# Patient Record
Sex: Female | Born: 1955 | Race: White | Hispanic: No | Marital: Single | Smoking: Never smoker
Health system: Southern US, Community
[De-identification: ages and names within clinical notes are randomized; demographics above are authoritative.]

## PROBLEM LIST (undated history)

## (undated) DIAGNOSIS — F419 Anxiety disorder, unspecified: Secondary | ICD-10-CM

## (undated) DIAGNOSIS — M419 Scoliosis, unspecified: Secondary | ICD-10-CM

## (undated) HISTORY — PX: SPINAL FUSION: SHX223

---

## 2017-03-26 ENCOUNTER — Emergency Department (HOSPITAL_COMMUNITY)
Admission: EM | Admit: 2017-03-26 | Discharge: 2017-03-26 | Disposition: A | Discharge status: Home / Self Care | Payer: PRIVATE HEALTH INSURANCE | Attending: Emergency Medicine | Admitting: Emergency Medicine

## 2017-03-26 ENCOUNTER — Emergency Department (HOSPITAL_COMMUNITY): Payer: PRIVATE HEALTH INSURANCE

## 2017-03-26 ENCOUNTER — Encounter (HOSPITAL_COMMUNITY): Payer: Self-pay | Admitting: Emergency Medicine

## 2017-03-26 DIAGNOSIS — S29012A Strain of muscle and tendon of back wall of thorax, initial encounter: Secondary | ICD-10-CM | POA: Diagnosis not present

## 2017-03-26 DIAGNOSIS — S299XXA Unspecified injury of thorax, initial encounter: Secondary | ICD-10-CM | POA: Diagnosis present

## 2017-03-26 DIAGNOSIS — M546 Pain in thoracic spine: Secondary | ICD-10-CM

## 2017-03-26 DIAGNOSIS — Y999 Unspecified external cause status: Secondary | ICD-10-CM | POA: Insufficient documentation

## 2017-03-26 DIAGNOSIS — Z79899 Other long term (current) drug therapy: Secondary | ICD-10-CM | POA: Insufficient documentation

## 2017-03-26 DIAGNOSIS — X500XXA Overexertion from strenuous movement or load, initial encounter: Secondary | ICD-10-CM | POA: Diagnosis not present

## 2017-03-26 DIAGNOSIS — Y939 Activity, unspecified: Secondary | ICD-10-CM | POA: Insufficient documentation

## 2017-03-26 DIAGNOSIS — Y929 Unspecified place or not applicable: Secondary | ICD-10-CM | POA: Insufficient documentation

## 2017-03-26 DIAGNOSIS — G8929 Other chronic pain: Secondary | ICD-10-CM

## 2017-03-26 DIAGNOSIS — T148XXA Other injury of unspecified body region, initial encounter: Secondary | ICD-10-CM

## 2017-03-26 HISTORY — DX: Anxiety disorder, unspecified: F41.9

## 2017-03-26 HISTORY — DX: Scoliosis, unspecified: M41.9

## 2017-03-26 MED ORDER — KETOROLAC TROMETHAMINE 15 MG/ML IJ SOLN: 15.0000 mg | Freq: Once | INTRAMUSCULAR | Status: DC

## 2017-03-26 MED ORDER — METHOCARBAMOL 500 MG PO TABS: 500.0000 mg | ORAL_TABLET | Freq: Two times a day (BID) | ORAL | 0 refills | Status: AC

## 2017-03-26 MED ORDER — KETOROLAC TROMETHAMINE 15 MG/ML IJ SOLN
15.0000 mg | Freq: Once | INTRAMUSCULAR | Status: AC
  Administered 2017-03-26: 15 mg via INTRAMUSCULAR
  Filled 2017-03-26: qty 1

## 2017-03-26 NOTE — Discharge Instructions (Signed)
You were given a prescription for Robaxin. You should avoid driving, working, operating machinery while taking this medication as it can make you drowsy.  You may also take Aleve for your symptoms.  You should contact your spine doctor at home and inform them of the symptoms you are having these last few days.  You should return to the emergency department for any chest pain, shortness of breath, numbness or weakness to your legs, loss of control of your bowels or bladder, inability to urinate, or any new or worsening symptoms.

## 2017-03-26 NOTE — ED Provider Notes (Signed)
[facility]-EMERGENCY DEPT Provider Note   CSN: [id number] Arrival date & time: 03/26/17  1234     History   Chief Complaint Chief Complaint  Patient presents with  . Back Pain    HPI Laura Townsend is a 62 y.o. female.  HPI   Pt is 62 y/o female with a complex spinal disease hx including a h/o scoliosis, kyphosis, DDD, spinal stenosis, spondylolisthesis, spinal fusion, chronic back pain who presents to the ED today c/o 8/10 midline and right sided upper back pain that began yesterday after she carried several heavy bags throughout the airport. Pt right handed.  States pain has stayed constant since yesterday. Has taken aleve with mild relief. States pain is constant and worsened with certain movements. States that she had had similar pain in the past multiple times and has been evaluated by her spine doctor in [place] for this who dx her with musculoskeletal pain, gave her antiinflammatories, and pain resolved after about 1-2 weeks.  She reports intermittent weakness/numbness to BLE, worse in right. No numbness/weakness/paresthesias currently. Has been ambulatory without issue during the last 2 days and denies any falls.  States she has had sxs like this in the past with her chronic back pain. Denies saddle anesthesia. Denies loss of control of bowels or bladder. No urinary retention. No fevers. Denies a h/o IVDU. Denies a h/o CA or recent unintended weight loss.  Has not had any recent spinal injections.   Past Medical History:  Diagnosis Date  . Anxiety   . Scoliosis     There are no active problems to display for this patient.   OB History    No data available       Home Medications    Prior to Admission medications   Medication Sig Start Date End Date Taking? Authorizing Provider  buPROPion (WELLBUTRIN) 100 MG tablet Take 1 tablet by mouth 2 (two) times daily. 03/17/17  Yes [provider]  gabapentin (NEURONTIN) 100 MG capsule Take 4-6  capsules by mouth 3 (three) times daily. 400mg  in the morning, 400mg  in the evening, 600mg  at night 03/20/17  Yes [provider]  LORazepam (ATIVAN) 0.5 MG tablet Take 1 tablet by mouth 3 (three) times daily as needed for anxiety. 03/17/17  Yes [provider]  naratriptan (AMERGE) 2.5 MG tablet Take 1 tablet by mouth daily as needed for migraine. 02/10/17  Yes [provider]  methocarbamol (ROBAXIN) 500 MG tablet Take 1 tablet (500 mg total) by mouth 2 (two) times daily. 03/26/17   Miela Desjardin S, PA-C    Family History No family history on file.  Social History Social History   Tobacco Use  . Smoking status: Not on file  Substance Use Topics  . Alcohol use: Not on file  . Drug use: Not on file     Allergies   Cephalosporins and Penicillins   Review of Systems Review of Systems  Constitutional: Negative for fever.  HENT: Negative for ear pain.   Respiratory: Negative for shortness of breath.   Cardiovascular: Negative for chest pain, palpitations and leg swelling.  Gastrointestinal: Negative for abdominal pain, constipation, diarrhea, nausea and vomiting.       No bowel incontinence  Genitourinary: Negative for dysuria, flank pain, hematuria and urgency.       No urinary incontinence, no urinary retention  Musculoskeletal: Positive for back pain. Negative for gait problem and neck pain.  Skin: Negative for rash.  Neurological: Positive for weakness (resolved)  and numbness (resolved). Negative for headaches.       No saddle anesthesia     Physical Exam Updated Vital Signs BP 137/78 (BP Location: Left Arm)   Pulse 63   Temp (!) 97.5 F (36.4 C) (Oral)   Resp 18   SpO2 100%   Physical Exam  Constitutional: She appears well-developed and well-nourished. No distress.  HENT:  Head: Normocephalic and atraumatic.  Eyes: Conjunctivae are normal.  Neck: Normal range of motion. Neck supple.  No Cervical spine tenderness to midline    Cardiovascular: Normal rate, regular rhythm, normal heart sounds and intact distal pulses.  No murmur heard. Pulmonary/Chest: Effort normal and breath sounds normal. No respiratory distress.  Abdominal: Soft. There is no tenderness.  Musculoskeletal: She exhibits no edema.  Severe scoliosis noted to the back, multiple surgical scars along the midline.  Tenderness along surgical scar to thoracic back as well as right paraspinous tenderness that reproduces her pain.  Neurological: She is alert.  Motor:  Normal tone. 5/5 strength of BUE and BLE major muscle groups including strong and equal grip strength and dorsiflexion/plantar flexion Sensory: light touch normal in all extremities. DTRs: patellar and achilles 2+ symmetric b/l Cerebellar: normal finger-to-nose with bilateral upper extremities Gait: normal gait and balance. Walks briskly without obvious pain or limp. Able to walk on toes and heels with ease.  CV: 2+ radial and DP/PT pulses  Skin: Skin is warm and dry. Capillary refill takes less than 2 seconds.  Psychiatric: She has a normal mood and affect.  Nursing note and vitals reviewed.    ED Treatments / Results  Labs (all labs ordered are listed, but only abnormal results are displayed) Labs Reviewed - No data to display  EKG  EKG Interpretation None       Radiology Dg Thoracic Spine 2 View  Result Date: 03/26/2017 CLINICAL DATA:  Increased upper back pain after carrying heavy luggage yesterday. EXAM: THORACIC SPINE 2 VIEWS COMPARISON:  None. FINDINGS: Severe scoliotic curvature to the thoracic spine, apex to the right. Kyphosis in the midthoracic spine. IMPRESSION: Marked scoliotic and postsurgical changes in the thoracic spine. There is a focal kyphosis in the midthoracic spine. These findings could all be chronic but it would be difficult to evaluate for an acute on chronic process in the setting of the chronic changes given the lack of previous studies. Electronically  Signed   By: Gerome Sam III M.D   On: 03/26/2017 17:18    Procedures Procedures (including critical care time)  Medications Ordered in ED Medications  ketorolac (TORADOL) 15 MG/ML injection 15 mg (15 mg Intramuscular Given 03/26/17 1712)     Initial Impression / Assessment and Plan / ED Course  I have reviewed the triage vital signs and the nursing notes.  Pertinent labs & imaging results that were available during my care of the patient were reviewed by me and considered in my medical decision making (see chart for details).    Discussed pt presentation and exam findings with Dr. Silverio Lay, who reviewed results of xray and agrees with the plan to d/c pt with outpt f/u.  Final Clinical Impressions(s) / ED Diagnoses   Final diagnoses:  Chronic thoracic back pain, unspecified back pain laterality  Muscle strain   Patient with acute exacerbation of back pain after carrying multiple heavy suitcases throughout the airport yesterday.  X-ray of thoracic spine showed scoliosis and postsurgical changes as well as focal kyphosis in the midthoracic spine, which patient states is chronic.  oradol injection administered in the ED which improved symptoms to patient's baseline.  Patient requesting Robaxin as this is helped her in the past.  Will give Rx for this.  No neurological deficits and normal neuro exam.  Patient can walk without pain.  No loss of bowel or bladder control.  No concern for cauda equina.  No fever, night sweats, weight loss, h/o cancer, IVDU.  RICE protocol and pain medicine indicated and discussed with patient. Do not feel that there is any new injury or change given patient states that her symptoms are similar to past and are resolved after Toradol in the ED. 5 patient to inform her spinal doctor of her visit to the emergency department today and gave her strict return precautions for any new or worsening symptoms or symptoms suggesting cauda equina or neurologic deficit.  Patient  understands reasons to return and agrees to follow-up as discussed.  All questions were answered patient is comfortable with the plan.   ED Discharge Orders        Ordered    methocarbamol (ROBAXIN) 500 MG tablet  2 times daily     03/26/17 1751       Joshlyn Beadle, [place], PA-C 03/26/17 1824    Charlynne Pander, MD 03/26/17 [phone]

## 2017-03-26 NOTE — ED Triage Notes (Signed)
Patient from out of town c/o upper back pain after carrying suitcase through airports yesterday. Hx scoliosis and spinal fusion. Reports "I have a specialist I see in [place], I just don't want the back pain to worsen while I am here visiting my mom."

## 2019-06-30 IMAGING — CR DG THORACIC SPINE 2V
3 series · 3 of 3 positions shown · non-contrast
Comparison: None.

CLINICAL DATA: Increased upper back pain after carrying heavy
luggage yesterday.

EXAM:
THORACIC SPINE 2 VIEWS

[t thoracic spine ap]
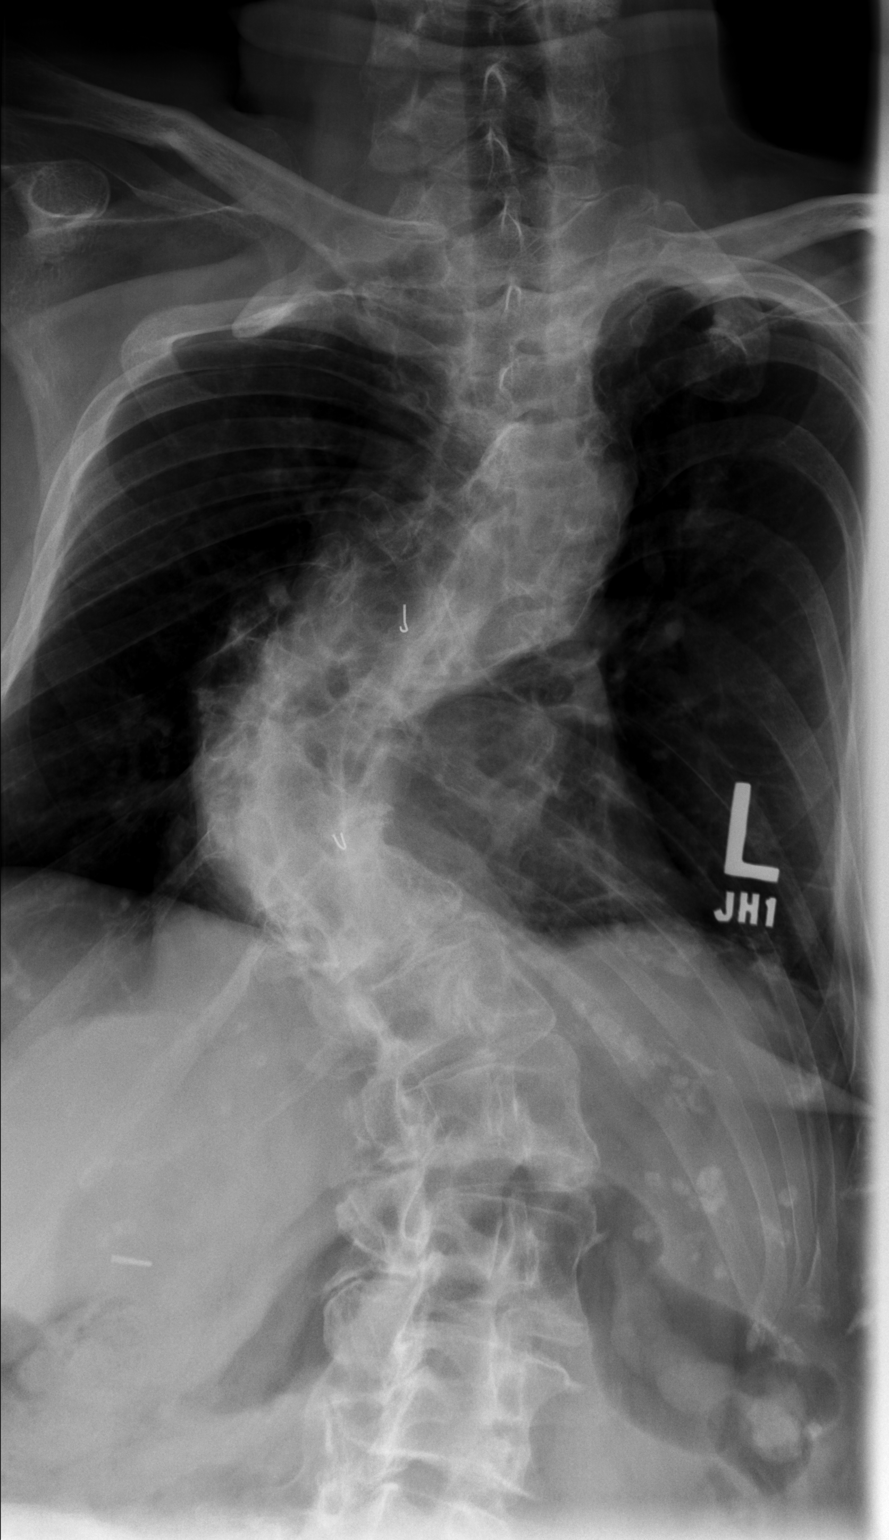

[t thoracic spine lat]
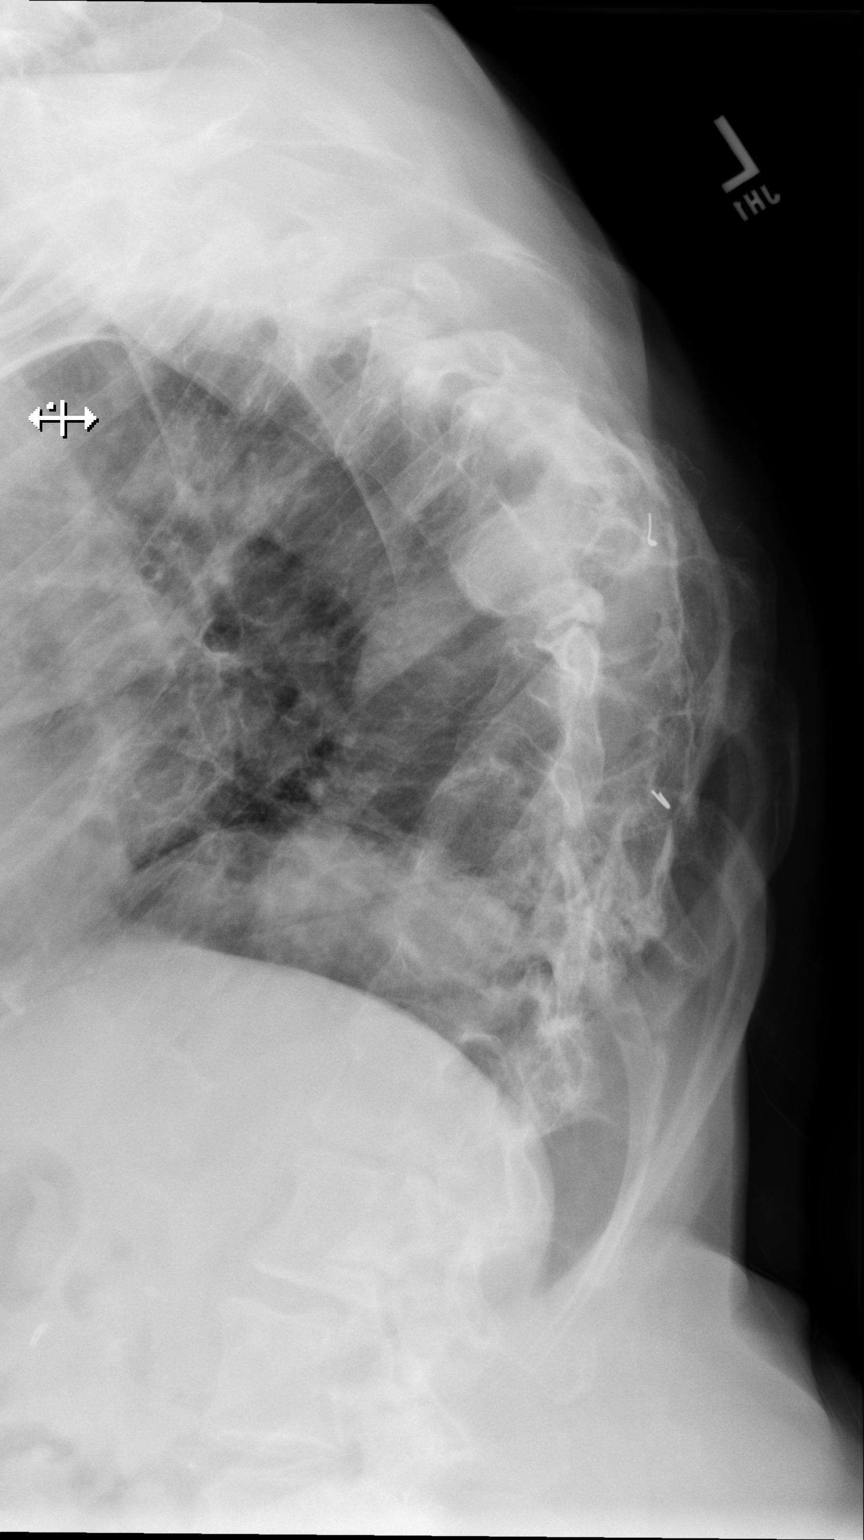

[t thoracic swimmers]
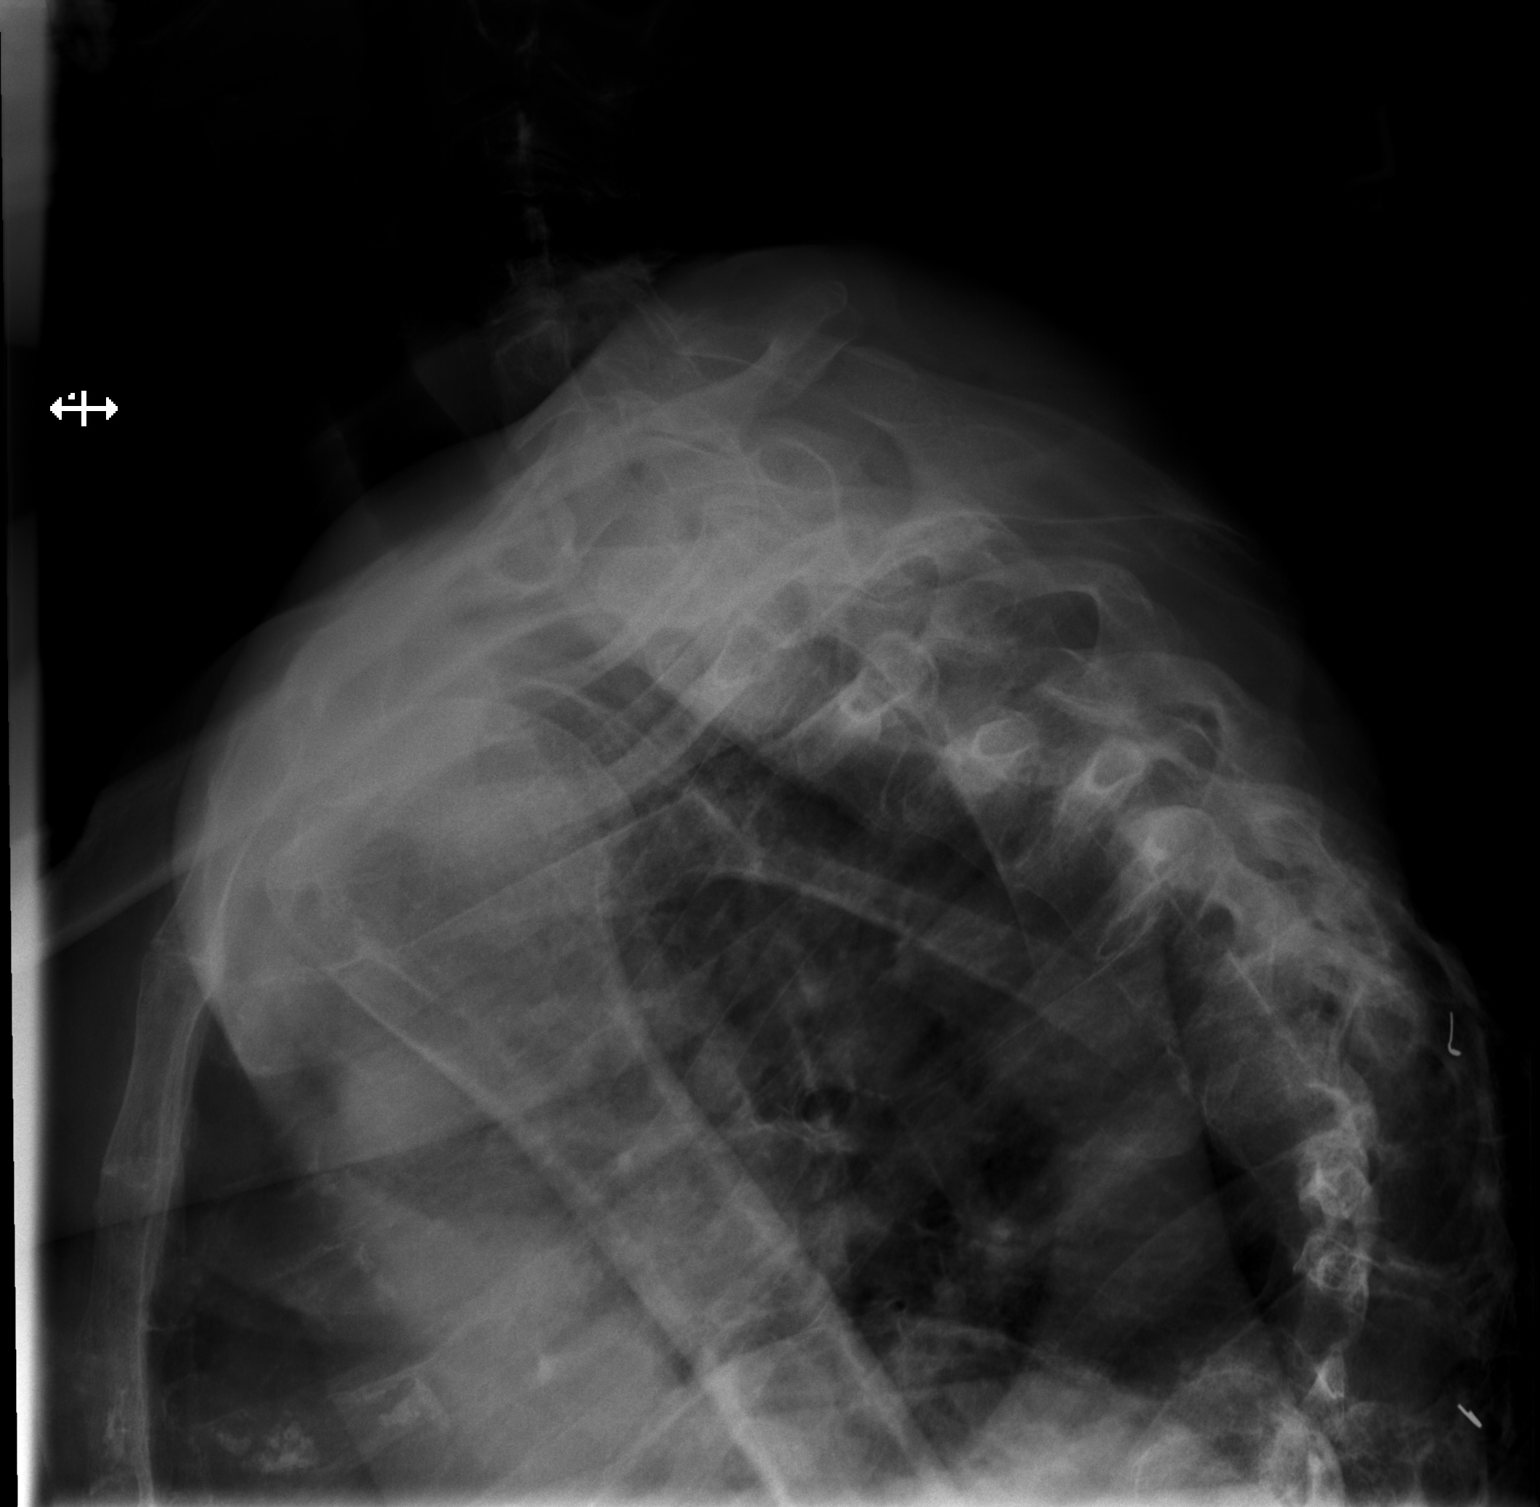

[3 of 3 positions shown; findings below may reference images not displayed]

FINDINGS: Severe scoliotic curvature to the thoracic spine, apex to the right.
Kyphosis in the midthoracic spine.
IMPRESSION: Marked scoliotic and postsurgical changes in the thoracic spine.
There is a focal kyphosis in the midthoracic spine. These findings
could all be chronic but it would be difficult to evaluate for an
acute on chronic process in the setting of the chronic changes given
the lack of previous studies.

## 2019-08-29 ENCOUNTER — Encounter: Payer: Self-pay | Admitting: Emergency Medicine

## 2019-08-29 ENCOUNTER — Ambulatory Visit
Admission: EM | Admit: 2019-08-29 | Discharge: 2019-08-29 | Disposition: A | Discharge status: Home / Self Care | Payer: PRIVATE HEALTH INSURANCE

## 2019-08-29 ENCOUNTER — Other Ambulatory Visit: Payer: Self-pay

## 2019-08-29 DIAGNOSIS — F4321 Adjustment disorder with depressed mood: Secondary | ICD-10-CM | POA: Diagnosis not present

## 2019-08-29 DIAGNOSIS — R0602 Shortness of breath: Secondary | ICD-10-CM | POA: Diagnosis not present

## 2019-08-29 DIAGNOSIS — F439 Reaction to severe stress, unspecified: Secondary | ICD-10-CM | POA: Diagnosis not present

## 2019-08-29 NOTE — ED Triage Notes (Addendum)
Patient has concerns for heavy breathing that she noticed today.  Patient states she has had intermittent episodes of heavy breathing that has occurred for 20 years.    Patient plans on getting on plane today

## 2019-08-29 NOTE — Discharge Instructions (Addendum)
Use inhaler if needed. Go to ER for worsening shortness of breath or you develop chest pain, lightheadedness, nausea/vomiting, weakness.

## 2019-08-29 NOTE — ED Provider Notes (Signed)
[facility]    CSN: [id number] Arrival date & time: 08/29/19  1153      History   Chief Complaint Chief Complaint  Patient presents with  . Shortness of Breath  . Fatigue    HPI Laura Townsend is a 64 y.o. female with history of scoliosis, anxiety presenting for perceived shortness of breath since today.  Noticed mild wheezing this morning: Did not take albuterol for this.  Denies chest pain, cough, fever, recent illness.  Patient has been under significant stress: Has been in [place] from out of state this week cleaning out her deceased mother's home.  Got into an argument with a family member, and has a flight later today.  Denies anxiety regarding flying.  Denies SI, HI.  Does have psychiatric care back home.  Palpitations, N/V, weakness, numbness, facial droop, confusion or slurred speech.   Past Medical History:  Diagnosis Date  . Anxiety   . Scoliosis     There are no problems to display for this patient.   Past Surgical History:  Procedure Laterality Date  . SPINAL FUSION      OB History   No obstetric history on file.      Home Medications    Prior to Admission medications   Medication Sig Start Date End Date Taking? Authorizing Provider  buPROPion (WELLBUTRIN) 100 MG tablet Take 1 tablet by mouth 2 (two) times daily. 03/17/17   [provider]  clonazePAM (KLONOPIN) 0.5 MG tablet Take 0.75 mg by mouth 2 (two) times daily. 08/26/19   [provider]  gabapentin (NEURONTIN) 100 MG capsule Take 4-6 capsules by mouth 3 (three) times daily. 400mg  in the morning, 400mg  in the evening, 600mg  at night 03/20/17   [provider]  LORazepam (ATIVAN) 0.5 MG tablet Take 1 tablet by mouth 3 (three) times daily as needed for anxiety. 03/17/17   [provider]  methocarbamol (ROBAXIN) 500 MG tablet Take 1 tablet (500 mg total) by mouth 2 (two) times daily. 03/26/17   Couture, Cortni S, PA-C  naratriptan (AMERGE) 2.5 MG  tablet Take 1 tablet by mouth daily as needed for migraine. 02/10/17   [provider]    Family History No family history on file.  Social History Social History   Tobacco Use  . Smoking status: Never Smoker  Substance Use Topics  . Alcohol use: Yes  . Drug use: Never     Allergies   Cephalosporins and Penicillins   Review of Systems As per HPI   Physical Exam Triage Vital Signs ED Triage Vitals [08/29/19 1203]  Enc Vitals Group     BP 135/82     Pulse Rate 86     Resp 18     Temp 98.2 F (36.8 C)     Temp Source Oral     SpO2 95 %     Weight      Height      Head Circumference      Peak Flow      Pain Score      Pain Loc      Pain Edu?      Excl. in GC?    No data found.  Updated Vital Signs BP 135/82 (BP Location: Right Arm)   Pulse 62   Temp 98.2 F (36.8 C) (Oral)   Resp 18   SpO2 98%   Visual Acuity Right Eye Distance:   Left Eye Distance:   Bilateral Distance:  Right Eye Near:   Left Eye Near:    Bilateral Near:     Physical Exam Constitutional:      General: She is not in acute distress.    Appearance: She is not ill-appearing or diaphoretic.  HENT:     Head: Normocephalic and atraumatic.     Mouth/Throat:     Mouth: Mucous membranes are moist.     Pharynx: Oropharynx is clear. No oropharyngeal exudate or posterior oropharyngeal erythema.  Eyes:     General: No scleral icterus.    Conjunctiva/sclera: Conjunctivae normal.     Pupils: Pupils are equal, round, and reactive to light.  Neck:     Comments: Trachea midline, negative JVD Cardiovascular:     Rate and Rhythm: Normal rate and regular rhythm.     Heart sounds: No murmur heard.  No gallop.   Pulmonary:     Effort: Pulmonary effort is normal. No respiratory distress.     Breath sounds: No wheezing, rhonchi or rales.  Musculoskeletal:     Cervical back: Neck supple. No tenderness.  Lymphadenopathy:     Cervical: No cervical adenopathy.  Skin:    Capillary  Refill: Capillary refill takes less than 2 seconds.     Coloration: Skin is not jaundiced or pale.     Findings: No rash.  Neurological:     General: No focal deficit present.     Mental Status: She is alert and oriented to person, place, and time.  Psychiatric:        Mood and Affect: Mood is anxious.        Behavior: Behavior is not agitated.     Comments: Good eye contact      UC Treatments / Results  Labs (all labs ordered are listed, but only abnormal results are displayed) Labs Reviewed - No data to display  EKG   Radiology No results found.  Procedures Procedures (including critical care time)  Medications Ordered in UC Medications - No data to display  Initial Impression / Assessment and Plan / UC Course  I have reviewed the triage vital signs and the nursing notes.  Pertinent labs & imaging results that were available during my care of the patient were reviewed by me and considered in my medical decision making (see chart for details).     Pt afebrile, nontoxic and w/o respiratory distress.  No airway compromise & cardiopulmonary exam is reassuring at this time.  Likely stress/anxiety component.  Return precautions discussed, pt verbalized understanding and is agreeable to plan. Final Clinical Impressions(s) / UC Diagnoses   Final diagnoses:  Shortness of breath  Grief  Stress at home     Discharge Instructions     Use inhaler if needed. Go to ER for worsening shortness of breath or you develop chest pain, lightheadedness, nausea/vomiting, weakness.    ED Prescriptions    None     PDMP not reviewed this encounter.   Hall-Potvin, [place], [place] 08/29/19 1330
